# Patient Record
Sex: Female | Born: 1980 | Hispanic: Yes | Marital: Single | Smoking: Former smoker
Health system: Southern US, Community
[De-identification: ages and names within clinical notes are randomized; demographics above are authoritative.]

## PROBLEM LIST (undated history)

## (undated) DIAGNOSIS — F1911 Other psychoactive substance abuse, in remission: Secondary | ICD-10-CM

## (undated) DIAGNOSIS — F329 Major depressive disorder, single episode, unspecified: Secondary | ICD-10-CM

## (undated) DIAGNOSIS — K5900 Constipation, unspecified: Secondary | ICD-10-CM

## (undated) DIAGNOSIS — G43909 Migraine, unspecified, not intractable, without status migrainosus: Secondary | ICD-10-CM

## (undated) DIAGNOSIS — K649 Unspecified hemorrhoids: Secondary | ICD-10-CM

## (undated) DIAGNOSIS — Z9151 Personal history of suicidal behavior: Secondary | ICD-10-CM

## (undated) DIAGNOSIS — Z62810 Personal history of physical and sexual abuse in childhood: Secondary | ICD-10-CM

## (undated) DIAGNOSIS — Z915 Personal history of self-harm: Secondary | ICD-10-CM

## (undated) HISTORY — PX: LIPOSUCTION: SHX10

## (undated) HISTORY — DX: Personal history of physical and sexual abuse in childhood: Z62.810

## (undated) HISTORY — DX: Unspecified hemorrhoids: K64.9

## (undated) HISTORY — PX: BREAST ENHANCEMENT SURGERY: SHX7

## (undated) HISTORY — PX: EYE SURGERY: SHX253

## (undated) HISTORY — DX: Constipation, unspecified: K59.00

---

## 2013-10-18 DIAGNOSIS — Z62810 Personal history of physical and sexual abuse in childhood: Secondary | ICD-10-CM

## 2013-10-18 HISTORY — DX: Personal history of physical and sexual abuse in childhood: Z62.810

## 2014-09-14 DIAGNOSIS — K649 Unspecified hemorrhoids: Secondary | ICD-10-CM

## 2014-09-14 HISTORY — DX: Unspecified hemorrhoids: K64.9

## 2014-10-04 DIAGNOSIS — Z9151 Personal history of suicidal behavior: Secondary | ICD-10-CM | POA: Insufficient documentation

## 2014-10-04 DIAGNOSIS — Z62810 Personal history of physical and sexual abuse in childhood: Secondary | ICD-10-CM | POA: Insufficient documentation

## 2014-10-13 NOTE — L&D Delivery Note (Signed)
Delivery Note At 3:29 AM a viable and healthy female was delivered via Vaginal, Spontaneous Delivery (Presentation: Occiput Anterior).  APGAR: 8, 9; weight 6 lb 2.8 oz (2800 g).   Placenta status: Intact, Spontaneous.  Cord: 3 vessels with the following complications: None.  Cord pH: not sent  Anesthesia: None  Episiotomy: None Lacerations: 1st degree;Perineal Suture Repair: vicryl Est. Blood Loss (mL):  300  Mom to postpartum.  Baby to Couplet care / Skin to Skin.  Brean Carberry MICHAEL 03/08/2015, 6:58 AM

## 2015-02-28 DIAGNOSIS — Z3A33 33 weeks gestation of pregnancy: Secondary | ICD-10-CM | POA: Diagnosis not present

## 2015-02-28 DIAGNOSIS — O4700 False labor before 37 completed weeks of gestation, unspecified trimester: Secondary | ICD-10-CM | POA: Diagnosis present

## 2015-02-28 DIAGNOSIS — O479 False labor, unspecified: Secondary | ICD-10-CM | POA: Diagnosis present

## 2015-02-28 NOTE — Discharge Instructions (Signed)
Preterm Labor Information Preterm labor is when labor starts before you are [redacted] weeks pregnant. The normal length of pregnancy is 39 to 41 weeks.  CAUSES  The cause of preterm labor is not often known. The most common known cause is infection. RISK FACTORS  Having a history of preterm labor.  Having your water break before it should.  Having a placenta that covers the opening of the cervix.  Having a placenta that breaks away from the uterus.  Having a cervix that is too weak to hold the baby in the uterus.  Having too much fluid in the amniotic sac.  Taking drugs or smoking while pregnant.  Not gaining enough weight while pregnant.  Being younger than 18 and older than 33 years old.  Having a low income.  Being African American. SYMPTOMS  Period-like cramps, belly (abdominal) pain, or back pain.  Contractions that are regular, as often as six in an hour. They may be mild or painful.  Contractions that start at the top of the belly. They then move to the lower belly and back.  Lower belly pressure that seems to get stronger.  Bleeding from the vagina.  Fluid leaking from the vagina. TREATMENT  Treatment depends on:  Your condition.  The condition of your baby.  How many weeks pregnant you are. Your doctor may have you:  Take medicine to stop contractions.  Stay in bed except to use the restroom (bed rest).  Stay in the hospital. WHAT SHOULD YOU DO IF YOU THINK YOU ARE IN PRETERM LABOR? Call your doctor right away. You need to go to the hospital right away.  HOW CAN YOU PREVENT PRETERM LABOR IN FUTURE PREGNANCIES?  Stop smoking, if you smoke.  Maintain healthy weight gain.  Do not take drugs or be around chemicals that are not needed.  Tell your doctor if you think you have an infection.  Tell your doctor if you had a preterm labor before. Document Released: 12/26/2008 Document Revised: 07/20/2013 Document Reviewed: 12/26/2008 [employer] Patient  Information 2015 [employer]. This information is not intended to replace advice given to you by your health care provider. Make sure you discuss any questions you have with your health care provider.  

## 2015-02-28 NOTE — Progress Notes (Signed)
Patient ID: Kaitlyn Cardenas, female   DOB: Apr 26, 1981, 34 y.o.   MRN: [id number] 33 week with ctx . No LOF , NO vaginal bleeding  O:VSS  Cx 1 cm , by RN  NST : irreg CTX . FHR 150 + accels reactive  A: ctx , no labor  P: precautions given to pt  D/c home

## 2015-02-28 NOTE — OB Triage Note (Signed)
Pt states she has been having contractions since 9am today.

## 2015-03-08 DIAGNOSIS — O26872 Cervical shortening, second trimester: Secondary | ICD-10-CM | POA: Diagnosis present

## 2015-03-08 DIAGNOSIS — O4700 False labor before 37 completed weeks of gestation, unspecified trimester: Secondary | ICD-10-CM | POA: Diagnosis present

## 2015-03-08 DIAGNOSIS — Z62810 Personal history of physical and sexual abuse in childhood: Secondary | ICD-10-CM | POA: Diagnosis present

## 2015-03-08 DIAGNOSIS — Z3A37 37 weeks gestation of pregnancy: Secondary | ICD-10-CM | POA: Diagnosis present

## 2015-03-08 DIAGNOSIS — Z9882 Breast implant status: Secondary | ICD-10-CM

## 2015-03-08 DIAGNOSIS — Z915 Personal history of self-harm: Secondary | ICD-10-CM

## 2015-03-08 DIAGNOSIS — Z428 Encounter for other plastic and reconstructive surgery following medical procedure or healed injury: Secondary | ICD-10-CM | POA: Diagnosis not present

## 2015-03-08 DIAGNOSIS — IMO0001 Reserved for inherently not codable concepts without codable children: Secondary | ICD-10-CM

## 2015-03-08 HISTORY — DX: Major depressive disorder, single episode, unspecified: F32.9

## 2015-03-08 HISTORY — DX: Personal history of physical and sexual abuse in childhood: Z62.810

## 2015-03-08 HISTORY — DX: Personal history of suicidal behavior: Z91.51

## 2015-03-08 HISTORY — DX: Personal history of self-harm: Z91.5

## 2015-03-08 HISTORY — DX: Other psychoactive substance abuse, in remission: F19.11

## 2015-03-09 NOTE — Discharge Summary (Signed)
Obstetric Discharge Summary Reason for Admission: onset of labor Prenatal Procedures: none Intrapartum Procedures: none Postpartum Procedures: none Complications-Operative and Postpartum: none HEMOGLOBIN  Date Value Ref Range Status  03/09/2015 9.8* 12.0 - 16.0 g/dL Final   HGB  Date Value Ref Range Status  07/25/2012 13.8 12.0-16.0 g/dL Final   HCT  Date Value Ref Range Status  03/09/2015 29.4* 35.0 - 47.0 % Final  07/25/2012 40.0 35.0-47.0 % Final    Physical Exam:  General: alert and cooperative Lochia: appropriate Uterine Fundus: firm  DVT Evaluation: No evidence of DVT seen on physical exam.  Discharge Diagnoses: Term Pregnancy-delivered  Discharge Information: Date: 03/09/2015 Activity: unrestricted Diet: routine Medications: Ibuprofen Condition: stable Instructions: Discharge to: home   Newborn Data: Live born female  Birth Weight: 6 lb 2.8 oz (2800 g) APGAR: 8, 9  Home with mother.  Governor Matos 03/09/2015, 9:42 AM

## 2015-03-09 NOTE — Discharge Instructions (Signed)
Physician Obstetric Discharge Summary  Patient ID: Kaitlyn Cardenas MRN: [id number] DOB/AGE: 03/01/1981 34 y.o.   Date of Admission: 03/08/2015  Date of Discharge:   Admitting Diagnosis: Onset of Labor at [redacted]w[redacted]d  Secondary Diagnosis:   Mode of Delivery: normal spontaneous vaginal delivery     Discharge Diagnosis: No other diagnosis   Intrapartum Procedures:    Post partum procedures:   Complications: none   Brief Hospital Course  Amariah Kierstead is a [id number] who had a SVD on 03/08/15;  for further details of this delivery, please refer to the delivery note.  Patient had an uncomplicated postpartum course.  By time of discharge on PPD#1, her pain was controlled on oral pain medications; she had appropriate lochia and was ambulating, voiding without difficulty and tolerating regular diet.  She was deemed stable for discharge to home.       Labs: CBC Latest Ref Rng 03/09/2015 03/08/2015 07/25/2012  WBC 3.6 - 11.0 K/uL 10.5 15.6(H) 8.6  Hemoglobin 12.0 - 16.0 g/dL 1.4(N) 10.5(L) 13.8  Hematocrit 35.0 - 47.0 % 29.4(L) 31.8(L) 40.0  Platelets 150 - 440 K/uL 183 175 219   O POS  Physical exam:  Blood pressure 116/72, pulse 72, temperature 98.2 F (36.8 C), temperature source Oral, resp. rate 18, height  (1.575 m), weight 75.297 kg (166 lb), last menstrual period 06/22/2014, SpO2 98 %, unknown if currently breastfeeding. General: alert and no distress Lochia: appropriate Abdomen: soft, NT Uterine Fundus: firm Extremities: No evidence of DVT seen on physical exam. No lower extremity edema.  Discharge Instructions: Per After Visit Summary. Activity: Advance as tolerated. Pelvic rest for 6 weeks.  Also refer to Discharge Instructions Diet: Regular Medications:   Medication List    TAKE these medications        ibuprofen 600 MG tablet  Commonly known as:  ADVIL,MOTRIN  Take 1 tablet (600 mg total) by mouth every 6 (six) hours.       Outpatient follow  up:  Postpartum contraception: IUD  Discharged Condition: good  Discharged to: home   Newborn Data: Disposition:home with mother  Apgars: APGAR (1 MIN): 8   APGAR (5 MINS): 9   APGAR (10 MINS):    Baby Feeding: Breast  SCHERMERHORN,THOMAS, MD 03/09/2015 9:36 AM      Breast Pumping Tips If you are breastfeeding, there may be times when you cannot feed your baby directly. Returning to work or going on a trip are common examples. Pumping allows you to store breast milk and feed it to your baby later.  You may not get much milk when you first start to pump. Your breasts should start to make more after a few days. If you pump at the times you usually feed your baby, you may be able to keep making enough milk to feed your baby without also using formula. The more often you pump, the more milk you will produce. WHEN SHOULD I PUMP?   You can begin to pump soon after delivery. However, some experts recommend waiting about 4 weeks before giving your infant a bottle to make sure breastfeeding is going well.  If you plan to return to work, begin pumping a few weeks before. This will help you develop techniques that work best for you. It also lets you build up a supply of breast milk.   When you are with your infant, feed on demand and pump after each feeding.   When you are away from your infant for several hours,  pump for about 15 minutes every 2-3 hours. Pump both breasts at the same time if you can.   If your infant has a formula feeding, make sure to pump around the same time.   If you drink any alcohol, wait 2 hours before pumping.  HOW DO I PREPARE TO PUMP? Your let-down reflexis the natural reaction to stimulation that makes your breast milk flow. It is easier to stimulate this reflex when you are relaxed. Find relaxation techniques that work for you. If you have difficulty with your let-down reflex, try these methods:   Smell one of your infant's blankets or an item of  clothing.   Look at a picture or video of your infant.   Sit in a quiet, private space.   Massage the breast you plan to pump.   Place soothing warmth on the breast.   Play relaxing music.  WHAT ARE SOME GENERAL BREAST PUMPING TIPS?  Wash your hands before you pump. You do not need to wash your nipples or breasts.  There are three ways to pump.  You can use your hand to massage and compress your breast.  You can use a handheld manual pump.  You can use an electric pump.   Make sure the suction cup (flange) on the breast pump is the right size. Place the flange directly over the nipple. If it is the wrong size or placed the wrong way, it may be painful and cause nipple damage.   If pumping is uncomfortable, apply a small amount of purified or modified lanolin to your nipple and areola.  If you are using an electric pump, adjust the speed and suction power to be more comfortable.  If pumping is painful or if you are not getting very much milk, you may need a different type of pump. A lactation consultant can help you determine what type of pump to use.   Keep a full water bottle near you at all times. Drinking lots of fluid helps you make more milk.  You can store your milk to use later. Pumped breast milk can be stored in a sealable, sterile container or plastic bag. Label all stored breast milk with the date you pumped it.  Milk can stay out at room temperature for up to 8 hours.  You can store your milk in the refrigerator for up to 8 days.  You can store your milk in the freezer for 3 months. Thaw frozen milk using warm water. Do not put it in the microwave.  Do not smoke. Smoking can lower your milk supply and harm your infant. If you need help quitting, ask your health care provider to recommend a program.  WHEN SHOULD I CALL MY HEALTH CARE PROVIDER OR A LACTATION CONSULTANT?  You are having trouble pumping.  You are concerned that you are not making  enough milk.  You have nipple pain, soreness, or redness.  You want to use birth control. Birth control pills may lower your milk supply. Talk to your health care provider about your options. Document Released: 03/19/2010 Document Revised: 10/04/2013 Document Reviewed: 07/22/2013 [facility] Patient Information 2015 [place], [place]. This information is not intended to replace advice given to you by your health care provider. Make sure you discuss any questions you have with your health care provider.  Nutrition for the New Mother  A new mother needs good health and nutrition so she can have energy to take care of a new baby. Whether a mother breastfeeds or  formula feeds the baby, it is important to have a well-balanced diet. Foods from all the food groups should be chosen to meet the new mother's energy needs and to give her the nutrients needed for repair and healing.  A HEALTHY EATING PLAN The My Pyramid plan for Moms outlines what you should eat to help you and your baby stay healthy. The energy and amount of food you need depends on whether or not you are breastfeeding. If you are breastfeeding you will need more nutrients. If you choose not to breastfeed, your nutrition goal should be to return to a healthy weight. Limiting calories may be needed if you are not breastfeeding.  HOME CARE INSTRUCTIONS   For a personal plan based on your unique needs, see your Registered Dietitian or visit [email or web address].  Eat a variety of foods. The plan below will help guide you. The following chart has a suggested daily meal plan from the My Pyramid for Moms.  Eat a variety of fruits and vegetables.  Eat more dark green and orange vegetables and cooked dried beans.  Make half your grains whole grains. Choose whole instead of refined grains.  Choose low-fat or lean meats and poultry.  Choose low-fat or fat-free dairy products like milk, cheese, or yogurt. Fruits  Breastfeeding: 2  cups  Non-Breastfeeding: 2 cups  What Counts as a serving?  1 cup of fruit or juice.   cup dried fruit. Vegetables  Breastfeeding: 3 cups  Non-Breastfeeding: 2  cups  What Counts as a serving?  1 cup raw or cooked vegetables.  Juice or 2 cups raw leafy vegetables. Grains  Breastfeeding: 8 oz  Non-Breastfeeding: 6 oz  What Counts as a serving?  1 slice bread.  1 oz ready-to-eat cereal.   cup cooked pasta, rice, or cereal. Meat and Beans  Breastfeeding: 6  oz  Non-Breastfeeding: 5  oz  What Counts as a serving?  1 oz lean meat, poultry, or fish   cup cooked dry beans   oz nuts or 1 egg  1 tbs peanut butter Milk  Breastfeeding: 3 cups  Non-Breastfeeding: 3 cups  What Counts as a serving?  1 cup milk.  8 oz yogurt.  1  oz cheese.  2 oz processed cheese. TIPS FOR THE BREASTFEEDING MOM  Rapid weight loss is not suggested when you are breastfeeding. By simply breastfeeding, you will be able to lose the weight gained during your pregnancy. Your caregiver can keep track of your weight and tell you if your weight loss is appropriate.  Be sure to drink fluids. You may notice that you are thirstier than usual. A suggestion is to drink a glass of water or other beverage whenever you breastfeed.  Avoid alcohol as it can be passed into your breast milk.  Limit caffeine drinks to no more than 2 to 3 cups per day.  You may need to keep taking your prenatal vitamin while you are breastfeeding. Talk with your caregiver about taking a vitamin or supplement. RETURING TO A HEALTHY WEIGHT  The My Pyramid Plan for Moms will help you return to a healthy weight. It will also provide the nutrients you need.  You may need to limit "empty" calories. These include:  High fat foods like fried foods, fatty meats, fast food, butter, and mayonnaise.  High sugar foods like sodas, jelly, candy, and sweets.  Be physically active. Include 30 minutes of exercise  or more each day. Choose an activity you like such as walking, swimming,  biking, or aerobics. Check with your caregiver before you start to exercise. Document Released: 01/06/2008 Document Revised: 12/22/2011 Document Reviewed: 01/06/2008  Call your doctor for increased pain or vaginal bleeding, temperature above 100.4, depression, or concerns.  No strenuous activity or heavy lifting for 6 weeks.  No intercourse, tampons, douching, or enemas for 6 weeks.  No tub baths-showers only.  No driving for 2 weeks or while taking pain medications.  Continue prenatal vitamin and iron.  Increase calories and fluids while breastfeeding.

## 2017-07-08 DIAGNOSIS — R51 Headache: Secondary | ICD-10-CM | POA: Diagnosis present

## 2017-07-08 DIAGNOSIS — Z79899 Other long term (current) drug therapy: Secondary | ICD-10-CM | POA: Diagnosis not present

## 2017-07-08 DIAGNOSIS — Z87891 Personal history of nicotine dependence: Secondary | ICD-10-CM | POA: Insufficient documentation

## 2017-07-08 DIAGNOSIS — G43909 Migraine, unspecified, not intractable, without status migrainosus: Secondary | ICD-10-CM | POA: Diagnosis not present

## 2017-07-08 HISTORY — DX: Migraine, unspecified, not intractable, without status migrainosus: G43.909

## 2017-07-08 MED ADMIN — Ketorolac Tromethamine Inj 30 MG/ML: 30 mg | INTRAVENOUS

## 2017-07-08 MED ADMIN — Diphenhydramine HCl Inj 50 MG/ML: 50 mg | INTRAVENOUS

## 2017-07-08 MED ADMIN — Metoclopramide HCl Inj 5 MG/ML (Base Equivalent): 10 mg | INTRAVENOUS

## 2017-07-08 NOTE — ED Triage Notes (Signed)
Patient with complaint of intermittent headaches times three months. Patient states that she saw NP last Monday for the same and was prescribed Zolmitriptan, Meclizine and Flonase. Patient states that she has had a headache that started this morning and that the medications are not helping. Patient states that she has a history of migraines but has not had a migraine in years. Patient states that she has a neurologist appointment scheduled for Oct 19th.

## 2017-07-08 NOTE — ED Notes (Signed)
Pt alert, oriented, ambulatory. Denies needing interpreter.   States HA x 3 months. States had migraines when she was 20. States HA is circumferential. States blurred vision intermittently in both eyes. States dizzy/lightheaded. Nausea.   States light, sounds, movement bother her intermittently.   States heating pad on neck this morning, "took one of my pills."

## 2017-07-08 NOTE — ED Notes (Signed)
Pt taken to CT.

## 2017-07-08 NOTE — ED Notes (Signed)
Patient transported to CT 

## 2018-04-22 DIAGNOSIS — Z369 Encounter for antenatal screening, unspecified: Secondary | ICD-10-CM

## 2018-04-22 DIAGNOSIS — K5900 Constipation, unspecified: Secondary | ICD-10-CM

## 2018-04-22 HISTORY — DX: Constipation, unspecified: K59.00

## 2018-05-13 VITALS — BP 130/74 | HR 98 | Temp 98.7°F | Resp 18 | Wt 150.4 lb

## 2018-05-13 DIAGNOSIS — O09521 Supervision of elderly multigravida, first trimester: Secondary | ICD-10-CM

## 2018-05-13 DIAGNOSIS — Z3A12 12 weeks gestation of pregnancy: Secondary | ICD-10-CM | POA: Insufficient documentation

## 2018-05-13 DIAGNOSIS — Z31438 Encounter for other genetic testing of female for procreative management: Secondary | ICD-10-CM | POA: Diagnosis not present

## 2018-05-13 DIAGNOSIS — Z3689 Encounter for other specified antenatal screening: Secondary | ICD-10-CM | POA: Insufficient documentation

## 2018-05-13 DIAGNOSIS — Z315 Encounter for genetic counseling: Secondary | ICD-10-CM | POA: Diagnosis not present

## 2018-05-13 DIAGNOSIS — Z8279 Family history of other congenital malformations, deformations and chromosomal abnormalities: Secondary | ICD-10-CM

## 2018-05-13 DIAGNOSIS — Z369 Encounter for antenatal screening, unspecified: Secondary | ICD-10-CM

## 2018-05-13 NOTE — Progress Notes (Addendum)
Referring Provider: [facility] Department Length of Consultation: 45 minutes  [name] was referred to [facility] [place] for genetic counseling because of advanced maternal age and a family history of Down syndrome.  The patient will be 37 years old at the time of delivery.  This note summarizes the information we discussed.    We explained that the chance of a chromosome abnormality increases with maternal age.  Chromosomes and examples of chromosome problems were reviewed.  Humans typically have 46 chromosomes in each cell, with half passed through each sperm and egg.  Any change in the number or structure of chromosomes can increase the risk of problems in the physical and mental development of a pregnancy.   Based upon age of the patient, the chance of any chromosome abnormality was 1 in 49. The chance of Down syndrome, the most common chromosome problem associated with maternal age, was 1 in 19.  The risk of chromosome problems is in addition to the 3% general population risk for birth defects and mental retardation.  The greatest chance, of course, is that the baby would be born in good health.  We discussed the following prenatal screening and testing options for this pregnancy:  First trimester screening, which includes nuchal translucency ultrasound screen and first trimester maternal serum marker screening.  The nuchal translucency has approximately an 80% detection rate for Down syndrome and can be positive for other chromosome abnormalities as well as heart defects.  When combined with a maternal serum marker screening, the detection rate is up to 90% for Down syndrome and up to 97% for trisomy 18.     The chorionic villus sampling procedure is available for first trimester chromosome analysis.  This involves the withdrawal of a small amount of chorionic villi (tissue from the developing placenta).  Risk of pregnancy loss is estimated to be  approximately 1 in 200 to 1 in 100 (0.5 to 1%).  There is approximately a 1% (1 in 100) chance that the CVS chromosome results will be unclear.  Chorionic villi cannot be tested for neural tube defects.     Maternal serum marker screening, a blood test that measures pregnancy proteins, can provide risk assessments for Down syndrome, trisomy 18, and open neural tube defects (spina bifida, anencephaly). Because it does not directly examine the fetus, it cannot positively diagnose or rule out these problems.  Targeted ultrasound uses high frequency sound waves to create an image of the developing fetus.  An ultrasound is often recommended as a routine means of evaluating the pregnancy.  It is also used to screen for fetal anatomy problems (for example, a heart defect) that might be suggestive of a chromosomal or other abnormality.   Amniocentesis involves the removal of a small amount of amniotic fluid from the sac surrounding the fetus with the use of a thin needle inserted through the maternal abdomen and uterus.  Ultrasound guidance is used throughout the procedure.  Fetal cells from amniotic fluid are directly evaluated and > 99.5% of chromosome problems and > 98% of open neural tube defects can be detected. This procedure is generally performed after the 15th week of pregnancy.  The main risks to this procedure include complications leading to miscarriage in less than 1 in 200 cases (0.5%).  We also reviewed the availability of cell free fetal DNA testing from maternal blood to determine whether or not the baby may have either Down syndrome, trisomy 36, or trisomy 56.  This  test utilizes a maternal blood sample and DNA sequencing technology to isolate circulating cell free fetal DNA from maternal plasma.  The fetal DNA can then be analyzed for DNA sequences that are derived from the three most common chromosomes involved in aneuploidy, chromosomes 13, 18, and 21.  If the overall amount of DNA is greater  than the expected level for any of these chromosomes, aneuploidy is suspected.  While we do not consider it a replacement for invasive testing and karyotype analysis, a negative result from this testing would be reassuring, though not a guarantee of a normal chromosome complement for the baby.  An abnormal result is certainly suggestive of an abnormal chromosome complement, though we would still recommend CVS or amniocentesis to confirm any findings from this testing.  Cystic Fibrosis and Spinal Muscular Atrophy (SMA) screening were also discussed with the patient. Both conditions are recessive, which means that both parents must be carriers in order to have a child with the disease.  Cystic fibrosis (CF) is one of the most common genetic conditions in persons of Caucasian ancestry.  This condition occurs in approximately 1 in 2,500 Caucasian persons and results in thickened secretions in the lungs, digestive, and reproductive systems.  For a baby to be at risk for having CF, both of the parents must be carriers for this condition.  Approximately 1 in 78 Caucasian persons is a carrier for CF.  Current carrier testing looks for the most common mutations in the gene for CF and can detect approximately 90% of carriers in the Caucasian population.  This means that the carrier screening can greatly reduce, but cannot eliminate, the chance for an individual to have a child with CF.  If an individual is found to be a carrier for CF, then carrier testing would be available for the partner. As part of [place] 's newborn screening profile, all babies born in the state of [place] will have a two-tier screening process.  Specimens are first tested to determine the concentration of immunoreactive trypsinogen (IRT).  The top 5% of specimens with the highest IRT values then undergo DNA testing using a panel of over 40 common CF mutations. SMA is a neurodegenerative disorder that leads to atrophy of skeletal muscle  and overall weakness.  This condition is also more prevalent in the Caucasian population, with 1 in 40-1 in 60 persons being a carrier and 1 in 6,000-1 in 10,000 children being affected.  There are multiple forms of the disease, with some causing death in infancy to other forms with survival into adulthood.  The genetics of SMA is complex, but carrier screening can detect up to 95% of carriers in the Caucasian population.  Similar to CF, a negative result can greatly reduce, but cannot eliminate, the chance to have a child with SMA.  Prior testing for hemoglobinopathies was normal at [facility].  We obtained a detailed family history and pregnancy history.  The patient and her husband are of [place] ancestry and are non-consanguinous.  The father of the baby reported that one of his brothers has a daughter who required a liver transplant at age 42 months.  He is not aware of the cause for this condition, but we are happy to review this more if a diagnosis is known.  [name] stated that she has a maternal first cousin who had a daughter with Down syndrome who passed away in infancy due to a heart defect.  Down syndrome is caused by an extra copy  of the genetic instructions located on chromosome number 21.  These extra instructions result in the characteristic facial appearance, intellectual disabilities and an increased risk for some types of birth defects.  The majority (95%) of persons with Down syndrome have three freestanding copies of chromosome number 21, called trisomy 9921.  This type of Down syndrome occurs as a sporadic condition and does not increase the chance for other family members to have Down syndrome.  Rarely, Down syndrome is caused by a rearrangement of the genetic instructions, where the extra chromosome number 21 is attached to another chromosome.  This type of chromosome rearrangement can be passed down through families and therefore may increase the chance for Down syndrome in other family  members.  We cannot determine which type of Down syndrome is present without documentation of chromosome studies in the affected family member.  If any additional information is obtained about this history, please do not hesitate to contact us.  The patient reported that she had two ectopic pregnancies and that a maternal first cousin had three miscarriages, one of which was an ectopic pregnancy.  That cousin has two healthy children.  We reviewed that there may be many reasons for miscarriages and that we typically begin to evaluate possible inherited causes when a couple has had three or more unexplained losses, which is not the case in this family.  The remainder of the family history is unremarkable for birth defects, developmental delays, recurrent pregnancy loss or known chromosome abnormalities.  [name] stated that this is her sixth pregnancy.  She has two healthy sons, ages 1820 and 8810, from prior relationships as well as one ectopic pregnancy from a prior relationship.  With her current partner, she has a 36 year old daughter and one loss of an ectopic pregnancy. There have been no exposures or complications in this pregnancy that would be expected to increase the risk for birth defects.  She reported significant pain and pressure in her abdomen and back in this pregnancy and that she had a short cervix in her last pregnancy (which delivered at 37 weeks).  Dr. Fayrene FearingJames spoke with her about this history and a plan for measuring cervical length at her next visit here.  She also referred her for a [facility] consultation [facility].  After consideration of the options, [name] elected to proceed with cell free fetal DNA testing (MaterniT21 PLUS with [facility]) and to decline carrier screening for CF and SMA.  An ultrasound was performed at the time of the visit.  The gestational age was consistent with 12 weeks.  Fetal anatomy could not be assessed due to early gestational age.  Please refer to  the ultrasound report for details of that study.  [name] was encouraged to call with questions or concerns.  We can be contacted at [phone].   Tests Ordered:  MaterniT21 PLUS with [facility]   Cherly Andersoneborah F. Wells, MS, CGC   I was immediately available and supervising. [place] PonderAndra H. Jasa Dundon, MD [facility]

## 2018-05-20 NOTE — Telephone Encounter (Signed)
The patient was informed of the results of her recent MaterniT21 testing which yielded NEGATIVE results.  The patient's specimen showed DNA consistent with two copies of chromosomes 21, 18 and 13.  The sensitivity for trisomy 21, trisomy 18 and trisomy 13 using this testing are reported as 99.1%, 99.9% and 91.7% respectively.  Thus, while the results of this testing are highly accurate, they are not considered diagnostic at this time.  Should more definitive information be desired, the patient may still consider amniocentesis.   As requested to know by the patient, sex chromosome analysis was included for this sample.  Results are consistent with a female fetus (Y chromosome material was detected). This is predicted with >99% accuracy.  A maternal serum AFP only should be considered if screening for neural tube defects is desired.  We may be reached at [phone] with any questions or concerns.   Keenan Dimitrov F. Wayman Hoard, MS, CGC   

## 2018-06-21 DIAGNOSIS — O09522 Supervision of elderly multigravida, second trimester: Secondary | ICD-10-CM

## 2018-06-24 DIAGNOSIS — Z3A18 18 weeks gestation of pregnancy: Secondary | ICD-10-CM | POA: Insufficient documentation

## 2018-06-24 DIAGNOSIS — O09522 Supervision of elderly multigravida, second trimester: Secondary | ICD-10-CM | POA: Insufficient documentation

## 2018-06-24 DIAGNOSIS — Z3689 Encounter for other specified antenatal screening: Secondary | ICD-10-CM | POA: Insufficient documentation

## 2018-07-12 NOTE — Addendum Note (Signed)
Encounter addended by: Lady Deutscher, MD on: 07/12/2018 9:48 AM  Actions taken: Sign clinical note

## 2018-07-19 DIAGNOSIS — O09522 Supervision of elderly multigravida, second trimester: Secondary | ICD-10-CM

## 2018-11-02 DIAGNOSIS — R710 Precipitous drop in hematocrit: Secondary | ICD-10-CM | POA: Diagnosis not present

## 2018-11-02 DIAGNOSIS — Z3A36 36 weeks gestation of pregnancy: Secondary | ICD-10-CM

## 2018-11-02 DIAGNOSIS — O0993 Supervision of high risk pregnancy, unspecified, third trimester: Secondary | ICD-10-CM

## 2018-11-02 DIAGNOSIS — Z87891 Personal history of nicotine dependence: Secondary | ICD-10-CM

## 2018-11-02 MED ADMIN — Lactated Ringer's Solution: INTRAVENOUS | @ 21:00:00 | NDC 00338011704

## 2018-11-02 MED ADMIN — Lidocaine HCl Local Preservative Free (PF) Inj 1%: @ 12:00:00 | NDC 00409427916

## 2018-11-02 MED ADMIN — Lactated Ringer's Solution: INTRAVENOUS | @ 10:00:00 | NDC 00338011704

## 2018-11-02 NOTE — Lactation Note (Addendum)
This note was copied from a baby's chart. Lactation Consultation Note  Patient Name: Kaitlyn Cardenas NOIBB'C Date: 11/02/2018 Reason for consult: Initial assessment;Late-preterm 34-36.6wks   Maternal Data Formula Feeding for Exclusion: No Does the patient have breastfeeding experience prior to this delivery?: Yes Mom had breast augment in 2013 with implants under muscle and inframammary incision, states problem with low milk supply with all kids, nipple cracking with last child, only breastfed a few days   Feeding Feeding Type: Breast Fed  LATCH Score Latch: Repeated attempts needed to sustain latch, nipple held in mouth throughout feeding, stimulation needed to elicit sucking reflex.  Audible Swallowing: A few with stimulation  Type of Nipple: Everted at rest and after stimulation  Comfort (Breast/Nipple): Filling, red/small blisters or bruises, mild/mod discomfort(firm breast, implants under muscle)  Hold (Positioning): Assistance needed to correctly position infant at breast and maintain latch.  LATCH Score: 6  Interventions Interventions: Breast feeding basics reviewed;Assisted with latch;Skin to skin;Adjust position;Support pillows  Lactation Tools Discussed/Used WIC Program: Yes   Consult Status Consult Status: Follow-up Date: 11/02/18 Follow-up type: In-patient    Kaitlyn Cardenas 11/02/2018, 1:14 PM
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
## 2018-11-02 NOTE — Lactation Note (Signed)
This note was copied from a baby's chart. Lactation Consultation Note  Patient Name: Kaitlyn Cardenas NFAOZ'H Date: 11/02/2018 Reason for consult: Initial assessment;Late-preterm 34-36.6wks Low blood sugar of 36, mom desires to supplement with formula for this feeding, offered option to use breast pump to obtain colostrum but mom declines at this time   Maternal Data Formula Feeding for Exclusion: No Does the patient have breastfeeding experience prior to this delivery?: Yes  Feeding Feeding Type: Breast Fed  LATCH Score Latch: Repeated attempts needed to sustain latch, nipple held in mouth throughout feeding, stimulation needed to elicit sucking reflex.  Audible Swallowing: A few with stimulation  Type of Nipple: Everted at rest and after stimulation  Comfort (Breast/Nipple): Filling, red/small blisters or bruises, mild/mod discomfort(firm breast, implants under muscle)  Hold (Positioning): Assistance needed to correctly position infant at breast and maintain latch.  LATCH Score: 6  Interventions Interventions: Breast feeding basics reviewed;Assisted with latch;Skin to skin;Adjust position;Support pillows  Lactation Tools Discussed/Used WIC Program: Yes   Consult Status Consult Status: Follow-up Date: 11/02/18 Follow-up type: In-patient    Dyann Kief 11/02/2018, 4:10 PM

## 2018-11-03 NOTE — Progress Notes (Signed)
Patient ID: Kaitlyn Cardenas, female   DOB: 08/17/1981, 38 y.o.   MRN: [id number] Baby staying . Cancel mother d/c

## 2018-11-03 NOTE — Lactation Note (Signed)
This note was copied from a baby's chart. Lactation Consultation Note  Patient Name: Kaitlyn Cardenas [id number] Date: 11/03/2018 Reason for consult: Follow-up assessment   Maternal Data    Feeding Feeding Type: Bottle Fed - Formula Nipple Type: Extra Slow Flow  LATCH Score                   Interventions    Lactation Tools Discussed/Used     Consult Status Consult Status: Complete Mother of baby states that breastfeeding is going well and that she doesn't have any concerns or questions at this time. This is mom's 4th baby and she has breastfeed baby's siblings all past 15mos successfully. Mom knows where she can go for breastfeeding support/resources after d/c.    Kaitlyn Cardenas 11/03/2018, 12:48 PM

## 2018-11-04 NOTE — Discharge Instructions (Signed)
After Your Delivery Discharge Instructions   Postpartum: Care Instructions  After childbirth (postpartum period), your body goes through many changes. Some of these changes happen over several weeks. In the hours after delivery, your body will begin to recover from childbirth while it prepares to breastfeed your newborn. You may feel emotional during this time. Your hormones can shift your mood without warning for no clear reason.  In the first couple of weeks after childbirth, many women have emotions that change from happy to sad. You may find it hard to sleep. You may cry a lot. This is called the "baby blues." These overwhelming emotions often go away within a couple of days or weeks. But it's important to discuss your feelings with your doctor.  You should call your care provider if you have unrelieved feelings of:  Inability to cope  Sadness  Anxiety  Lack of interest in baby  Insomnia  Crying  It is easy to get too tired and overwhelmed during the first weeks after childbirth. Don't try to do too much. Get rest whenever you can, accept help from others, and eat well and drink plenty of fluids.  About 4 to 6 weeks after your baby's birth, you will have a follow-up visit with your care provider. This visit is your time to talk to your provider about anything you are concerned or curious about.  Follow-up care is a key part of your treatment and safety. Be sure to make and go to all appointments, and call your doctor if you are having problems. It's also a good idea to know your test results and keep a list of the medicines you take.  How can you care for yourself at home?  Sleep or rest when your baby sleeps.  Get help with household chores from family or friends, if you can. Do not try to do it all yourself.  If you have hemorrhoids or swelling or pain around the opening of your vagina, try using cold and heat. You can put ice or a cold pack on the area for 10 to 20 minutes at  a time. Put a thin cloth between the ice and your skin. Also try sitting in a few inches of warm water (sitz bath) 3 times a day and after bowel movements.  Take pain medicines exactly as directed.  If the provider gave you a prescription medicine for pain, take it as prescribed.  If you do not have a prescription and need something over the counter, you can take:  Ibuprofen (Motrin, Advil) up to 600mg every 6 hours as needed for pain  Acetaminophen (Tylenol) up to 650mg every 4 hours as needed for pain  Some people find it helpful to alternate between these two medications.   No driving for 1-2 weeks or while taking pain medications.   Eat more fiber to avoid constipation. Include foods such as whole-grain breads and cereals, raw vegetables, raw and dried fruits, and beans.  Drink plenty of fluids, enough so that your urine is light yellow or clear like water. If you have kidney, heart, or liver disease and have to limit fluids, talk with your doctor before you increase the amount of fluids you drink.  Do not put anything in the vagina for 6 weeks. This means no sex, no tampons, no douching, and no enemas.  If you have stitches, keep the area clean by pouring or spraying warm water over the area outside your vagina and anus after you use the toilet.    No strenuous activity or heavy lifting for 6 weeks   No tub baths; showers only  Continue prenatal vitamin and iron.  If breastfeeding:  Increase calories and fluids while breastfeeding.  You may have a slight fever when your milk comes in, but it should go away on its own. If it does not, and rises above 101.0 please call the doctor.  For breastfeeding concerns, the lactation consultant can be reached at [phone].  For concerns about your baby, please call your pediatrician.   Keep a list of questions to bring to your postpartum visit. Your questions might be about:  Changes in your breasts, such as lumps or  soreness.  When to expect your menstrual period to start again.  What form of birth control is best for you.  Weight you have put on during the pregnancy.  Exercise options.  What foods and drinks are best for you, especially if you are breastfeeding.  Problems you might be having with breastfeeding.  When you can have sex. Some women may want to talk about lubricants for the vagina.  Any feelings of sadness or restlessness that you are having.   When should you call for help?  Call 911 anytime you think you may need emergency care. For example, call if:  You have thoughts of harming yourself, your baby, or another person.  You passed out (lost consciousness).  Call the office at [phone] or seek immediate medical care if:  If you have heavy bleeding such that you are soaking 1 pad in an hour for 2 hours  You are dizzy or lightheaded, or you feel like you may faint.  You have a fever; a temperature of 101.0 F or greater  Chills  Difficulty urinating  Headache unrelieved by "pain meds"   Visual changes  Pain in the right side of your belly near your ribs  Breasts reddened, hard, hot to the touch or any other breast concerns  Nipple discharge which is foul-smelling or contains pus   Increased pain at the site of the laceration   New pain unrelieved with recommended over-the-counter dosages  Difficulty breathing with or without chest pain   New leg pain, swelling, or redness, especially if it is only on one leg  Any other concerns  Watch closely for changes in your health, and be sure to contact your provider if:  You have new or worse vaginal discharge.  You feel sad or depressed.  You are having problems with your breasts or breastfeeding.    

## 2018-11-04 NOTE — Lactation Note (Incomplete)
This note was copied from a baby's chart. Lactation Consultation Note  Patient Name: Boy Shaniqua Huyser Today's Date: 11/04/2018     Maternal Data Formula Feeding for Exclusion: Yes Reason for exclusion: Mother's choice to formula and breast feed on admission Has patient been taught Hand Expression?: Yes Does the patient have breastfeeding experience prior to this delivery?: Yes  Feeding Feeding Type: Bottle Fed - Formula Nipple Type: Slow - flow  LATCH Score                   Interventions    Lactation Tools Discussed/Used     Consult Status  Mother is both breast and formula feeding. She states that her nipples are sore and cracking but does not think it is due to baby Daniel's latch. Mother states that her nipples were sore and cracked with her previous child while she breastfed and also when she was not breastfeeding    Arlyss Gandy 11/04/2018, 12:19 PM

## 2018-11-04 NOTE — Progress Notes (Signed)
Discharge order received from doctor. Reviewed discharge instructions and prescriptions with patient and answered all questions. Follow up appointment instructions given. Patient verbalized understanding. ID bands checked. Patient discharged home with infant via wheelchair by nursing/auxillary.    Markes Shatswell Garner, RN  

## 2018-11-12 NOTE — Lactation Note (Signed)
This note was copied from a baby's chart. Lactation Consultation Note  Patient Name: [name] Today's Date: 11/12/2018     Maternal Data    Feeding    LATCH Score                   Interventions    Lactation Tools Discussed/Used     Consult Status  Mother and baby Kaitlyn Cardenas are here today for an outpatient lactation consultation for a weighted feeding. Daniel's weight before the feeding is 3084 grams (6 lbs 12.8 oz). Mother breast-fed Kaitlyn Cardenas for 10 minutes on each breast. Throughout the feeding mother has to stimulate Kaitlyn Cardenas to stay awake and she mentions that he falls asleep during feedings. She denies any pain or discomfort during breastfeeding. After breastfeeding, Daniel's weight after the feeding is 3106 grams (6 lbs 13.6 oz). Kaitlyn Cardenas took approximately 22 mL during this breastfeeding session. Mother states that she breast-fed Kaitlyn Cardenas before coming to the hospital and he fed for about 7 minutes per side.    Arlyss Gandy 11/12/2018, 3:20 PM

## 2019-12-15 DIAGNOSIS — Z30430 Encounter for insertion of intrauterine contraceptive device: Secondary | ICD-10-CM

## 2019-12-15 DIAGNOSIS — Z3009 Encounter for other general counseling and advice on contraception: Secondary | ICD-10-CM

## 2019-12-15 NOTE — Progress Notes (Signed)
Patient here for IUD, unsure what type. Would like to discuss options, and states she and her partner have been wanting to schedule vasectomy, but unable to connect with [facility] .Marland KitchenBurt Knack, RN

## 2019-12-15 NOTE — Progress Notes (Signed)
Mirena consents signed and instructions given. Patient given Mirena card.Burt Knack, RN

## 2019-12-15 NOTE — Progress Notes (Signed)
Patient presented to [facility] for IUD insertion. Her GC/CT screening was not up to date so this was collected today and using WHO criteria we can be reasonably certain she is not pregnant . Urine pregnancy test  today was N/A- no sex since August and had recent LMP 2/18.  See Flowsheet for IUD check list  IUD Insertion Procedure Note Patient identified, informed consent performed, consent signed.   Discussed risks of irregular bleeding, cramping, infection, malpositioning or misplacement of the IUD outside the uterus which may require further procedure such as laparoscopy. Time out was performed.    Speculum placed in the vagina.  Cervix visualized.  Cleaned with Betadine x 2.  Uterus sounded to 8 cm.  IUD placed per manufacturer's recommendations.  Strings trimmed to 3 cm.  Patient tolerated procedure well.   Patient was given post-procedure instructions- both agency handout and verbally by provider.  She was advised to have backup contraception for one week.  Patient was also asked to check IUD strings periodically or follow up in 4 weeks for IUD check.

## 2020-07-27 DIAGNOSIS — Z113 Encounter for screening for infections with a predominantly sexual mode of transmission: Secondary | ICD-10-CM

## 2020-07-27 NOTE — Progress Notes (Signed)
Wet mount reviewed by provider, no tx per provider order. Provider orders completed. 
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
## 2020-12-30 DIAGNOSIS — M5136 Other intervertebral disc degeneration, lumbar region: Secondary | ICD-10-CM

## 2020-12-30 DIAGNOSIS — Z87891 Personal history of nicotine dependence: Secondary | ICD-10-CM | POA: Diagnosis not present

## 2020-12-30 DIAGNOSIS — M545 Low back pain, unspecified: Secondary | ICD-10-CM | POA: Diagnosis present

## 2020-12-30 DIAGNOSIS — M5126 Other intervertebral disc displacement, lumbar region: Secondary | ICD-10-CM | POA: Diagnosis not present

## 2020-12-30 NOTE — ED Triage Notes (Signed)
Pt states was washing dishes standing up and when she was done tonight she was "not able to move". Pt complains of lower back pain and "not being able to get up If is sit". Pt is ambulatory slowly, denies change in sensation to lower extremities or loss of bowel or bladder.
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
## 2021-09-16 DIAGNOSIS — R079 Chest pain, unspecified: Secondary | ICD-10-CM | POA: Diagnosis not present

## 2021-09-16 DIAGNOSIS — R059 Cough, unspecified: Secondary | ICD-10-CM

## 2021-09-16 DIAGNOSIS — M5412 Radiculopathy, cervical region: Secondary | ICD-10-CM | POA: Diagnosis present

## 2021-09-16 DIAGNOSIS — R0602 Shortness of breath: Secondary | ICD-10-CM | POA: Diagnosis present
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
## 2021-09-16 NOTE — Discharge Instructions (Addendum)
Your EKG and chest x-ray normal.  All of your labs are also normal.  I suspect that your neck pain is what is causing your arm pain and numbness.  The chest pain is most likely muscular type chest pain.  I have sent a corticosteroid to the pharmacy for you.  You can take Tylenol for pain relief.  Also consider muscle rubs.  Similar information about neck pain below.  Make a follow-up appointment with your PCP for later this week or early next week for recheck.  Otherwise, you should go to [facility] walk-in urgent care in [place] for any worsening symptoms.  See red flag signs and symptoms or chest pain below but if your chest pain is not improving the next couple of days, you have increased shortness of breath, coughing up blood, chest pain on breathing, weakness you should call 911 or go to the ER for further work-up.  NECK PAIN: Stressed avoiding painful activities. This can exacerbate your symptoms and make them worse.  May apply heat to the areas of pain for some relief. Use medications as directed. Be aware of which medications make you drowsy and do not drive or operate any kind of heavy machinery while using the medication (ie pain medications or muscle relaxers). F/U with PCP for reexamination or return sooner if condition worsens or does not begin to improve over the next few days.   NECK PAIN RED FLAGS: If symptoms get worse than they are right now, you should come back sooner for re-evaluation. If you have increased numbness/ tingling or notice that the numbness/tingling is affecting the legs or saddle region, go to ER. If you ever lose continence go to ER.      CHEST PAIN RED FLAGS:  The main red flags to look out for that could mean a serious heart problem are; pain in the center of the chest, difficulty breathing, nausea, sweating, clammy skin, dizziness, pain radiating to the jaw/neck/arm, racing heart, etc. If any of those symptoms are present, you must immediately get to the ER.   You  have a condition requiring you to follow up with Orthopedics so please call one of the following office for appointment:   [facility] [address] [place], [place] [address] Phone: [phone]  [facility] [address], [place], [place] [address] Phone: [phone]

## 2021-09-16 NOTE — ED Triage Notes (Addendum)
Pt presents with complaints of shortness of breath, fatigue, nausea, neck stiffness, nasal congestion, and headache since this morning. Pt reports relief with sudafed. Pt also is having some numbness and tingling in her left hand and arm since she woke up this morning. Pt reports having covid x 2 weeks ago.

## 2021-10-20 NOTE — ED Notes (Signed)
Called pt x2 for triage without answer 

## 2021-10-20 NOTE — ED Notes (Signed)
No answer when called for triage 

## 2021-12-11 NOTE — Telephone Encounter (Signed)
Pt would like to speak to a nurse because she needs to know what her blood type is and she assumes that it would be on her file. ?

## 2021-12-13 NOTE — Telephone Encounter (Signed)
Phone call to pt. No answer. Message left on voicemail explaining RN returning call and she may contact [facility] Mon - Fri between 8 am and 5 pm. Jerel Shepherd, RN ? ?

## 2021-12-16 NOTE — Telephone Encounter (Signed)
Return call to patient this morning regarding her question about what blood type she is.  Per EPIC, [facility] has never drawn her blood type.  Will refer her to her OB GYN providers for that information.  Left a message to please return my call at [phone].  Hart Carwin, RN ? ?

## 2021-12-17 NOTE — Telephone Encounter (Signed)
Return call to patient regarding her question about what blood type she is.  Left a message to return my call at [phone].  Will direct her to call her OB GYN provider for that information due to [facility] has never drawn her blood type from what I can see in Epic. Hart Carwin, RN ? ?

## 2021-12-19 NOTE — Telephone Encounter (Signed)
No response to the attempted return calls made and messages left regarding her question about what blood type she is.  Will close phone note and await call from patient. Hart Carwin, RN ? ?

## 2022-02-10 DIAGNOSIS — R1030 Lower abdominal pain, unspecified: Secondary | ICD-10-CM | POA: Diagnosis not present

## 2022-02-10 DIAGNOSIS — R110 Nausea: Secondary | ICD-10-CM

## 2022-02-10 DIAGNOSIS — R197 Diarrhea, unspecified: Secondary | ICD-10-CM

## 2022-02-10 DIAGNOSIS — R000 Tachycardia, unspecified: Secondary | ICD-10-CM | POA: Diagnosis not present

## 2022-02-10 DIAGNOSIS — Z20822 Contact with and (suspected) exposure to covid-19: Secondary | ICD-10-CM | POA: Insufficient documentation

## 2022-02-10 DIAGNOSIS — R198 Other specified symptoms and signs involving the digestive system and abdomen: Secondary | ICD-10-CM

## 2022-02-10 DIAGNOSIS — R1084 Generalized abdominal pain: Secondary | ICD-10-CM

## 2022-02-10 NOTE — ED Notes (Signed)
Pt return from CT.

## 2022-02-10 NOTE — Discharge Instructions (Addendum)
Your CT today showed: ?FINDINGS: ?Lower chest: Lung bases demonstrate no acute consolidation or ?effusion. Normal cardiac size. ?Hepatobiliary: No focal liver abnormality is seen. No gallstones, ?gallbladder wall thickening, or biliary dilatation. ?Pancreas: Unremarkable. No pancreatic ductal dilatation or ?surrounding inflammatory changes. ?Spleen: Normal in size without focal abnormality. ?Adrenals/Urinary Tract: Adrenal glands are unremarkable. Kidneys are ?normal, without renal calculi, focal lesion, or hydronephrosis. ?Bladder is unremarkable. ?Stomach/Bowel: Stomach is nonenlarged. Fluid-filled nondilated loops ?of mid to distal small bowel, alternating with areas of thickened ?appearing small bowel with mucosal enhancement, for example series ?2, image 62. Negative appendix. ?Vascular/Lymphatic: No significant vascular findings are present. No ?enlarged abdominal or pelvic lymph nodes. ?Reproductive: Uterus and bilateral adnexa are unremarkable. ?Other: No free air.  Small free fluid in the pelvis ?Musculoskeletal: No acute or significant osseous findings. ?  ?IMPRESSION: ?1. Fluid-filled nondilated mid to distal small bowel with areas of ?small-bowel wall thickening and mucosal enhancement, findings ?suspected to be related to enteritis/ileus either due to infection ?or inflammatory bowel disease. ?2. Small free fluid in the pelvis ?

## 2022-02-10 NOTE — Discharge Instructions (Signed)
I recommend you go to the emergency room given the severity of your pain and since we do not have imaging capabilities in urgent care.  Please have your husband take either.  You were given 1 dose of Zofran in clinic today. ?
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
## 2022-02-10 NOTE — ED Notes (Signed)
Pt to CT

## 2022-02-10 NOTE — ED Notes (Signed)
Dr. Smith at the bedside.  

## 2022-02-10 NOTE — ED Triage Notes (Signed)
Pt c/o generalized abd cramping with nausea and  diarrhea since around 10am. And having pelvic pressure ?

## 2022-02-10 NOTE — ED Notes (Signed)
Pt signed esignature  D/c inst to pt iv dced 

## 2022-02-10 NOTE — ED Triage Notes (Signed)
Patient presents to Urgent Care with complaints of constant abdominal pain and diarrhea that started this morning. She states pain is at the center, describes it as cramping.  ?She reports eating [place] [employer] yesterday.   ? ?Denies fever, vomiting, changes in diet, or medications.  ?

## 2022-07-21 DIAGNOSIS — Z01812 Encounter for preprocedural laboratory examination: Secondary | ICD-10-CM
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
## 2022-07-25 DIAGNOSIS — F32A Depression, unspecified: Secondary | ICD-10-CM | POA: Insufficient documentation

## 2022-07-25 DIAGNOSIS — Z01812 Encounter for preprocedural laboratory examination: Secondary | ICD-10-CM

## 2022-07-25 DIAGNOSIS — Z8049 Family history of malignant neoplasm of other genital organs: Secondary | ICD-10-CM | POA: Diagnosis not present

## 2022-07-25 DIAGNOSIS — Z302 Encounter for sterilization: Secondary | ICD-10-CM | POA: Diagnosis present

## 2022-07-25 DIAGNOSIS — N920 Excessive and frequent menstruation with regular cycle: Secondary | ICD-10-CM | POA: Insufficient documentation

## 2022-07-25 DIAGNOSIS — Z3009 Encounter for other general counseling and advice on contraception: Secondary | ICD-10-CM

## 2022-07-25 HISTORY — PX: DILATATION & CURETTAGE/HYSTEROSCOPY WITH MYOSURE: SHX6511

## 2022-07-25 HISTORY — PX: DILITATION & CURRETTAGE/HYSTROSCOPY WITH NOVASURE ABLATION: SHX5568

## 2022-07-25 HISTORY — PX: LAPAROSCOPIC TUBAL LIGATION: SHX1937

## 2022-07-25 NOTE — Anesthesia Procedure Notes (Signed)
Procedure Name: Intubation Date/Time: 07/25/2022 9:10 AM  Performed by: Debe Coder, CRNAPre-anesthesia Checklist: Patient identified, Emergency Drugs available, Suction available and Patient being monitored Patient Re-evaluated:Patient Re-evaluated prior to induction Oxygen Delivery Method: Circle system utilized Preoxygenation: Pre-oxygenation with 100% oxygen Induction Type: IV induction Ventilation: Mask ventilation without difficulty Laryngoscope Size: McGraph and 3 Grade View: Grade I Tube type: Oral Tube size: 6.5 mm Number of attempts: 1 Airway Equipment and Method: Stylet and Oral airway Placement Confirmation: ETT inserted through vocal cords under direct vision, positive ETCO2 and breath sounds checked- equal and bilateral Secured at: 20 cm Tube secured with: Tape Dental Injury: Teeth and Oropharynx as per pre-operative assessment

## 2022-07-25 NOTE — Anesthesia Postprocedure Evaluation (Signed)
Anesthesia Post Note  Patient: Kaitlyn Cardenas  Procedure(s) Performed: LAPAROSCOPIC TUBAL LIGATION (Bilateral: Uterus) DILATATION & CURETTAGE/HYSTEROSCOPY WITH NOVASURE ABLATION DILATATION & CURETTAGE/HYSTEROSCOPY WITH MYOSURE (Uterus)  Patient location during evaluation: PACU Anesthesia Type: General Level of consciousness: awake and alert Pain management: pain level controlled Vital Signs Assessment: post-procedure vital signs reviewed and stable Respiratory status: spontaneous breathing, nonlabored ventilation, respiratory function stable and patient connected to nasal cannula oxygen Cardiovascular status: blood pressure returned to baseline and stable Postop Assessment: no apparent nausea or vomiting Anesthetic complications: no   No notable events documented.   Last Vitals:  Vitals:   07/25/22 1145 07/25/22 1207  BP: 102/76 103/61  Pulse: (!) 51 (!) 52  Resp: 14 16  Temp:  (!) 36.2 C  SpO2: 99%     Last Pain:  Vitals:   07/25/22 1207  TempSrc: Tympanic  PainSc: Asleep                 Arita Miss

## 2022-07-25 NOTE — Op Note (Signed)
Operative Report Laparoscopic bilateral tubal ligation Hysteroscopy with Dilation and Curettage; Novasure ablation   Indications: Heavy bleeding  Pre-operative Diagnosis: Menorrhagia, undesired fertility.  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 Exam under anesthesia 2. Laparoscopic bilateral tubal ligation 3.  Dilation and curettage 4. Operative hysteroscopy with myosure to remove excess proliferative tissue 5. Novasure endometrial ablation 6. Additionally, biopsy of posterior serosa  Surgeon: Cline Cools, MD  Assistant(s):  None  Anesthesia: General endotracheal anesthesia  Anesthesiologist: No responsible provider has been recorded for the case. Anesthesiologist: Corinda Gubler, MD CRNA: Reece Agar, CRNA  Estimated Blood Loss:  Minimal         Intraoperative medications: Toradol         Total IV Fluids:  Urine Output:         Specimens: Endocervical curettings, endometrial curettings         Complications:  None; patient tolerated the procedure well.         Disposition: PACU - hemodynamically stable.         Condition: stable  Findings: Uterus measuring 8.5 cm by sound; normal patulous cervix, vagina, perineum. Apical descent to the hymenal ring.   Left sided ovarian fossa and posterior cul de sac intraperitoneal adhesions were noted, filmy but extensive.  Uterus with extensive proliferative clear blebs throughout the posterior serosa and cervix, extending into the left pelvic sidewall.  I did biopsy this area.  Cervical length: 4.5 cm Uterine cavity length: 4 cm Uterine cavity width: 4.3 cm Power in watts: 92 Total time: 120 secs  Indication for procedure/Consents: 41 y.o. Kaitlyn Cardenas  here for scheduled surgery for the aforementioned diagnoses.  Risks of surgery were discussed with the patient including but not limited to: bleeding which may require transfusion; infection which  may require antibiotics; injury to uterus or surrounding organs; intrauterine scarring which may impair future fertility; need for additional procedures including laparotomy or laparoscopy; and other postoperative/anesthesia complications. Written informed consent was obtained.    Procedure Details:   The patient was taken to the operating room where general anesthesia was administered and was found to be adequate.  After a formal and adequate timeout was performed, she was placed in the dorsal lithotomy position and examined with the above findings. She was then prepped and draped in the sterile manner.   Her bladder was catheterized for an estimated amount of clear, yellow urine. A weighed speculum was then placed in the patient's vagina and a single tooth tenaculum was applied to the anterior lip of the cervix.  TECHNIQUE:  The patient was taken to the operating room where general anesthesia was obtained without difficulty.  She was then placed in the dorsal lithotomy position and prepared and draped in sterile fashion.  The bladder was cathed for an estimated amount of clear urine. After an adequate timeout was performed, a bivalved speculum was then placed in the patient's vagina, and the anterior lip of cervix grasped with the single-tooth tenaculum.  The uterine manipulator was then advanced into the uterus.  The speculum was removed from the vagina.   Attention was then turned to the patient's abdomen where a 5-mm skin incision was made in the umbilical fold.  The Optiview 5-mm trocar and sleeve were then advanced without difficulty with the laparoscope under direct visualization into the abdomen.  The abdomen was then insufflated with carbon dioxide gas and adequate pneumoperitoneum was obtained.  A survey of the patient's pelvis  and abdomen revealed entirely normal anatomy.     The fallopian tubes were observed and found to be normal in appearance. A 55mm port was placed in each lower quadrant  under direct visualization. A Ligasure device was then advanced through the operative port and used to coagulate and excise the distal portion of the Fallopian tube, including the fimbriated ends.  Good blanching and coagulation was noted at the site of the application.  There was no bleeding noted in the mesosalpinx.  A similar process was carried out on the right fallopian tube allowing for bilateral tubal sterilization.   Good hemostasis was noted overall. The instruments were then removed from the patient's abdomen and the fascial incision was repaired with 0 Vicryl, and the skin was closed with Dermabond.  The uterine manipulator was removed from the vagina without complications.   Her cervix was serially dilated to 15 [place] using Hanks dilators. The hysteroscope was introduced to reveal the above findings.The hysteroscope was also used to determine the level of the internal os, and measurements were confirmed. The uterine cavity was carefully examined, both ostia were recognized, and diffusely proliferative endometrium with polypoid fragments was noted.    The cavity appeared to be heart-shaped, in a standard arcuate formation.    A sharp curettage was then performed until there was a gritty texture in all four quadrants.    NOVASURE PROCEDURE DETAILS:   The cervix was further dilated to accommodate the NovaSure device.  The NovaSure device was inserted, and the cavity width was determined. Using a power of 92 watts, for 13 sec, the endometrial ablation was performed.   The hysteroscope was re-introduced into the uterine cavity to note a cauterized lining with some areas at the top of the cervix with bleeding. The tenaculum was removed from the anterior lip of the cervix, and the vaginal speculum was removed after noting good hemostasis.  She received iv acetaminophen and Toradol prior to leaving the OR. The patient tolerated the procedure well and was taken to the recovery area awake, extubated  and in stable condition.  The patient will be discharged to home as per PACU criteria.  Routine postoperative instructions given.  She was prescribed Percocet, Ibuprofen and Colace.  She will follow up in the clinic in two weeks for postoperative evaluation.
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
## 2022-07-25 NOTE — Interval H&P Note (Signed)
History and Physical Interval Note:  07/25/2022 7:16 AM  Kaitlyn Cardenas  has presented today for surgery, with the diagnosis of undesired fertility, abnormal uterine bleeding.  The various methods of treatment have been discussed with the patient and family. After consideration of risks, benefits and other options for treatment, the patient has consented to  Procedure(s): LAPAROSCOPIC TUBAL LIGATION (Bilateral) DILATATION & CURETTAGE/HYSTEROSCOPY WITH NOVASURE ABLATION (N/A) as a surgical intervention.  The patient's history has been reviewed, patient examined, no change in status, stable for surgery.  I have reviewed the patient's chart and labs.  Questions were answered to the patient's satisfaction.     Benjaman Kindler

## 2022-07-25 NOTE — Discharge Instructions (Addendum)
Laparoscopic Tubal Ligation Discharge Instructions  Laparoscopic tubal ligation and fulguration ties your fallopian tubes to prevent pregnancy in the future.   For the next three days, take ibuprofen and acetaminophen on a schedule, every 8 hours. You can take them together or you can intersperse them, and take one every four hours. I also gave you gabapentin for nighttime, to help you sleep and also to control pain. Take gabapentin medicines at night for at least the next 3 nights. You also have a narcotic, oxycodone, to take as needed if the above medicines don't help.  Postop constipation is a major cause of pain. Stay well hydrated, walk as you tolerate, and take over the counter senna as well as stool softeners if you need them.  RISKS AND COMPLICATIONS  Infection. Bleeding. Injury to surrounding organs. Anesthetic side effects. Failure of the procedure. Risks of future ectopic pregnancy  PROCEDURE  You may be given a medicine to help you relax (sedative) before the procedure. You will be given a medicine to make you sleep (general anesthetic) during the procedure. A tube will be put down your throat to help your breath while under general anesthesia. Two small cuts (incisions) are made in the lower abdominal area and one incision is made near the belly button. Your abdominal area will be inflated with a safe gas (carbon dioxide). This helps give the surgeon room to operate, visualize, and helps the surgeon avoid other organs. A thin, lighted tube (laparoscope) with a camera attached is inserted into your abdomen through the incision near the belly button. Other small instruments may also be inserted through other abdominal incisions. The fallopian tube is located and are removed. After the fallopian tube is removed, the gas is released from the abdomen. The incisions will be closed with stitches (sutures), and Dermabond. A bandage may be placed over the incisions.  AFTER THE  PROCEDURE  You will also have some mild abdominal discomfort for 3-7 days. You will be given pain medicine to ease any discomfort. As long as there are no problems, you may be allowed to go home. Someone will need to drive you home and be with you for at least 24 hours once home. You may have some mild discomfort in the throat. This is from the tube placed in your throat while you were sleeping. You may experience discomfort in the shoulder area from some trapped air between the liver and diaphragm. This sensation is normal and will slowly go away on its own.  HOME CARE INSTRUCTIONS  Take all medicines as directed. Only take over-the-counter or prescription medicines for pain, discomfort, or fever as directed by your caregiver. Resume daily activities as directed. Showers are preferred over baths. You may resume sexual activities in 1 week or as directed. Do not drive while taking narcotics.  SEEK MEDICAL CARE IF: . There is increasing abdominal pain. You feel lightheaded or faint. You have the chills. You have an oral temperature above 102 F (38.9 C). There is pus-like (purulent) drainage from any of the wounds. You are unable to pass gas or have a bowel movement. You feel sick to your stomach (nauseous) or throw up (vomit).  MAKE SURE YOU:  Understand these instructions. Will watch your condition. Will get help right away if you are not doing well or get worse.  [employer] Patient Information 2013 [employer]   AMBULATORY SURGERY  DISCHARGE INSTRUCTIONS   The drugs that you were given will stay in your system until tomorrow so   for the next 24 hours you should not:  Drive an automobile Make any legal decisions Drink any alcoholic beverage   You may resume regular meals tomorrow.  Today it is better to start with liquids and gradually work up to solid foods.  You may eat anything you prefer, but it is better to start with liquids, then soup and crackers, and gradually  work up to solid foods.   Please notify your doctor immediately if you have any unusual bleeding, trouble breathing, redness and pain at the surgery site, drainage, fever, or pain not relieved by medication.    Additional Instructions:   Please contact your physician with any problems or Same Day Surgery at [phone], Monday through Friday 6 am to 4 pm, or [place] [facility] Main number at [phone].    

## 2022-07-25 NOTE — Transfer of Care (Signed)
Immediate Anesthesia Transfer of Care Note  Patient: Kaitlyn Cardenas  Procedure(s) Performed: LAPAROSCOPIC TUBAL LIGATION (Bilateral: Uterus) DILATATION & CURETTAGE/HYSTEROSCOPY WITH NOVASURE ABLATION DILATATION & CURETTAGE/HYSTEROSCOPY WITH MYOSURE (Uterus)  Patient Location: PACU  Anesthesia Type:General  Level of Consciousness: awake, drowsy and patient cooperative  Airway & Oxygen Therapy: Patient Spontanous Breathing  Post-op Assessment: Report given to RN and Post -op Vital signs reviewed and stable  Post vital signs: Reviewed and stable  Last Vitals:  Vitals Value Taken Time  BP 93/65 07/25/22 1050  Temp 35.4 C 07/25/22 1050  Pulse 56 07/25/22 1053  Resp 0 07/25/22 1053  SpO2 99 % 07/25/22 1053  Vitals shown include unvalidated device data.  Last Pain:  Vitals:   07/25/22 1050  TempSrc:   PainSc: Asleep         Complications: No notable events documented.

## 2022-07-29 IMAGING — CR DG CHEST 2V
2 series · 2 of 2 positions shown · non-contrast
Comparison: None.

CLINICAL DATA: Cough

EXAM:
CHEST - 2 VIEW

[chest pa]
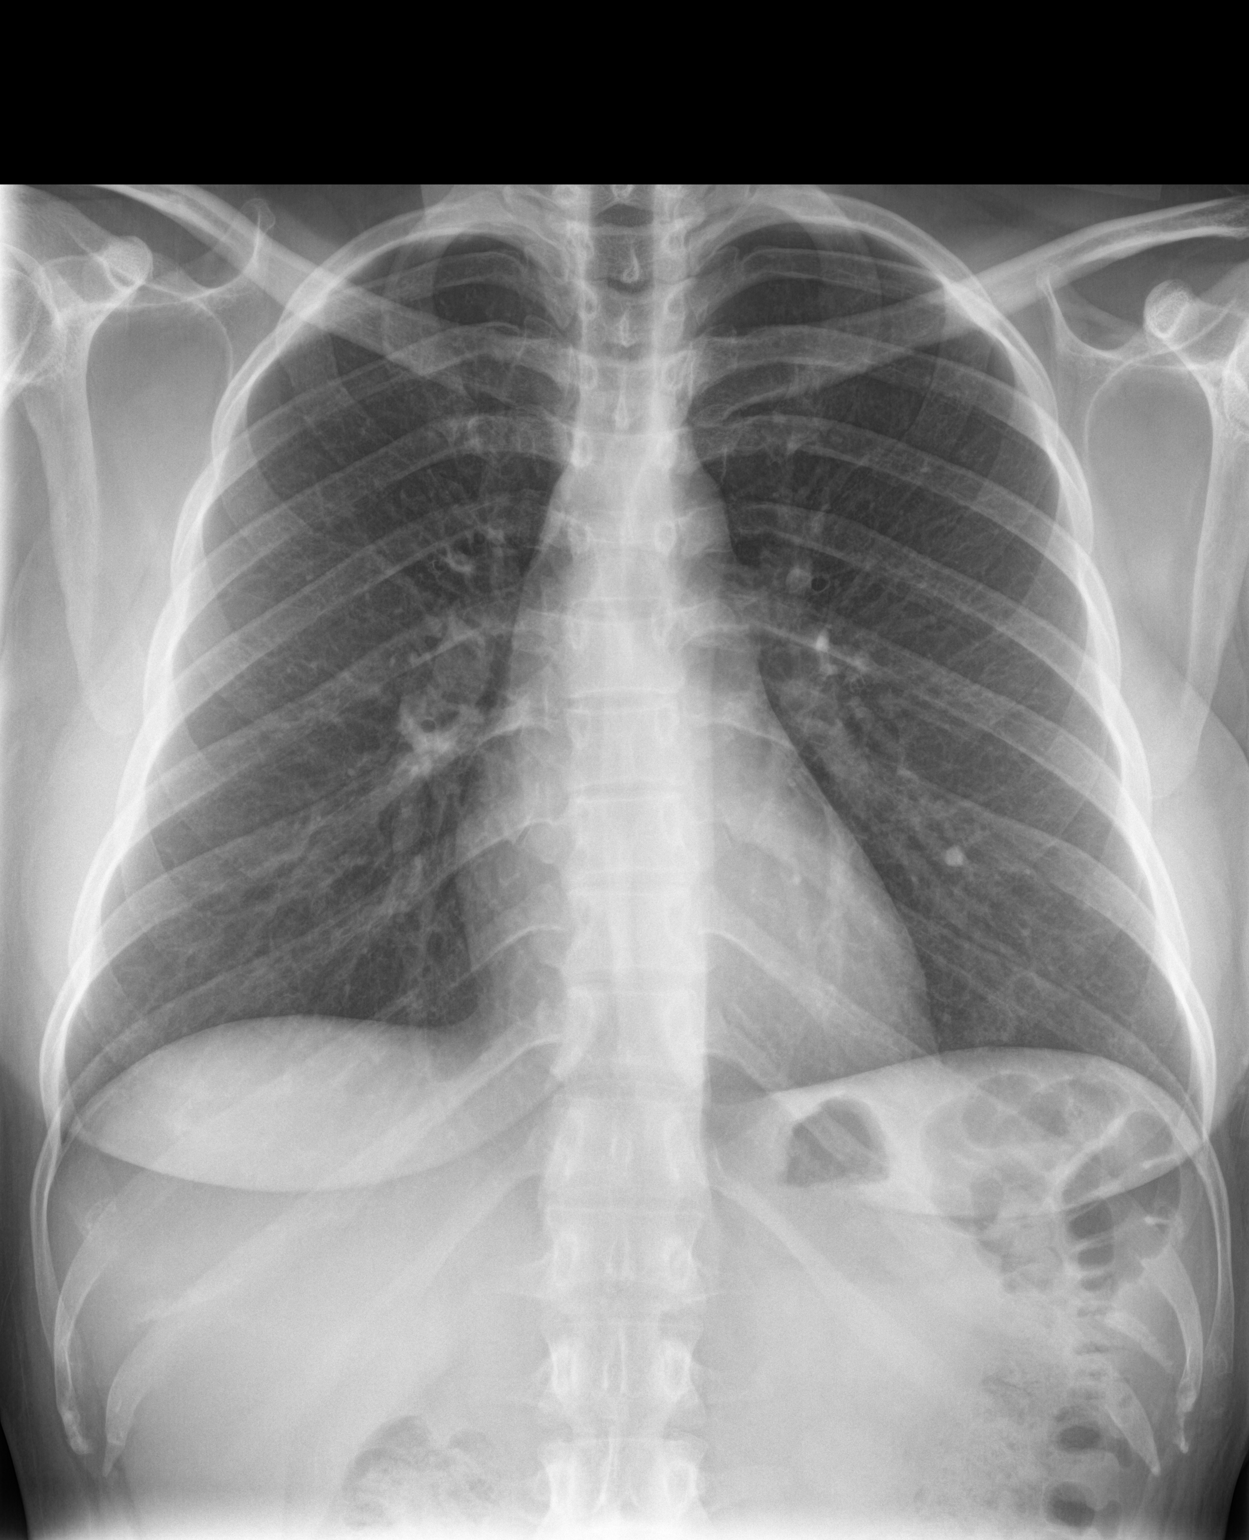

[chest lat]
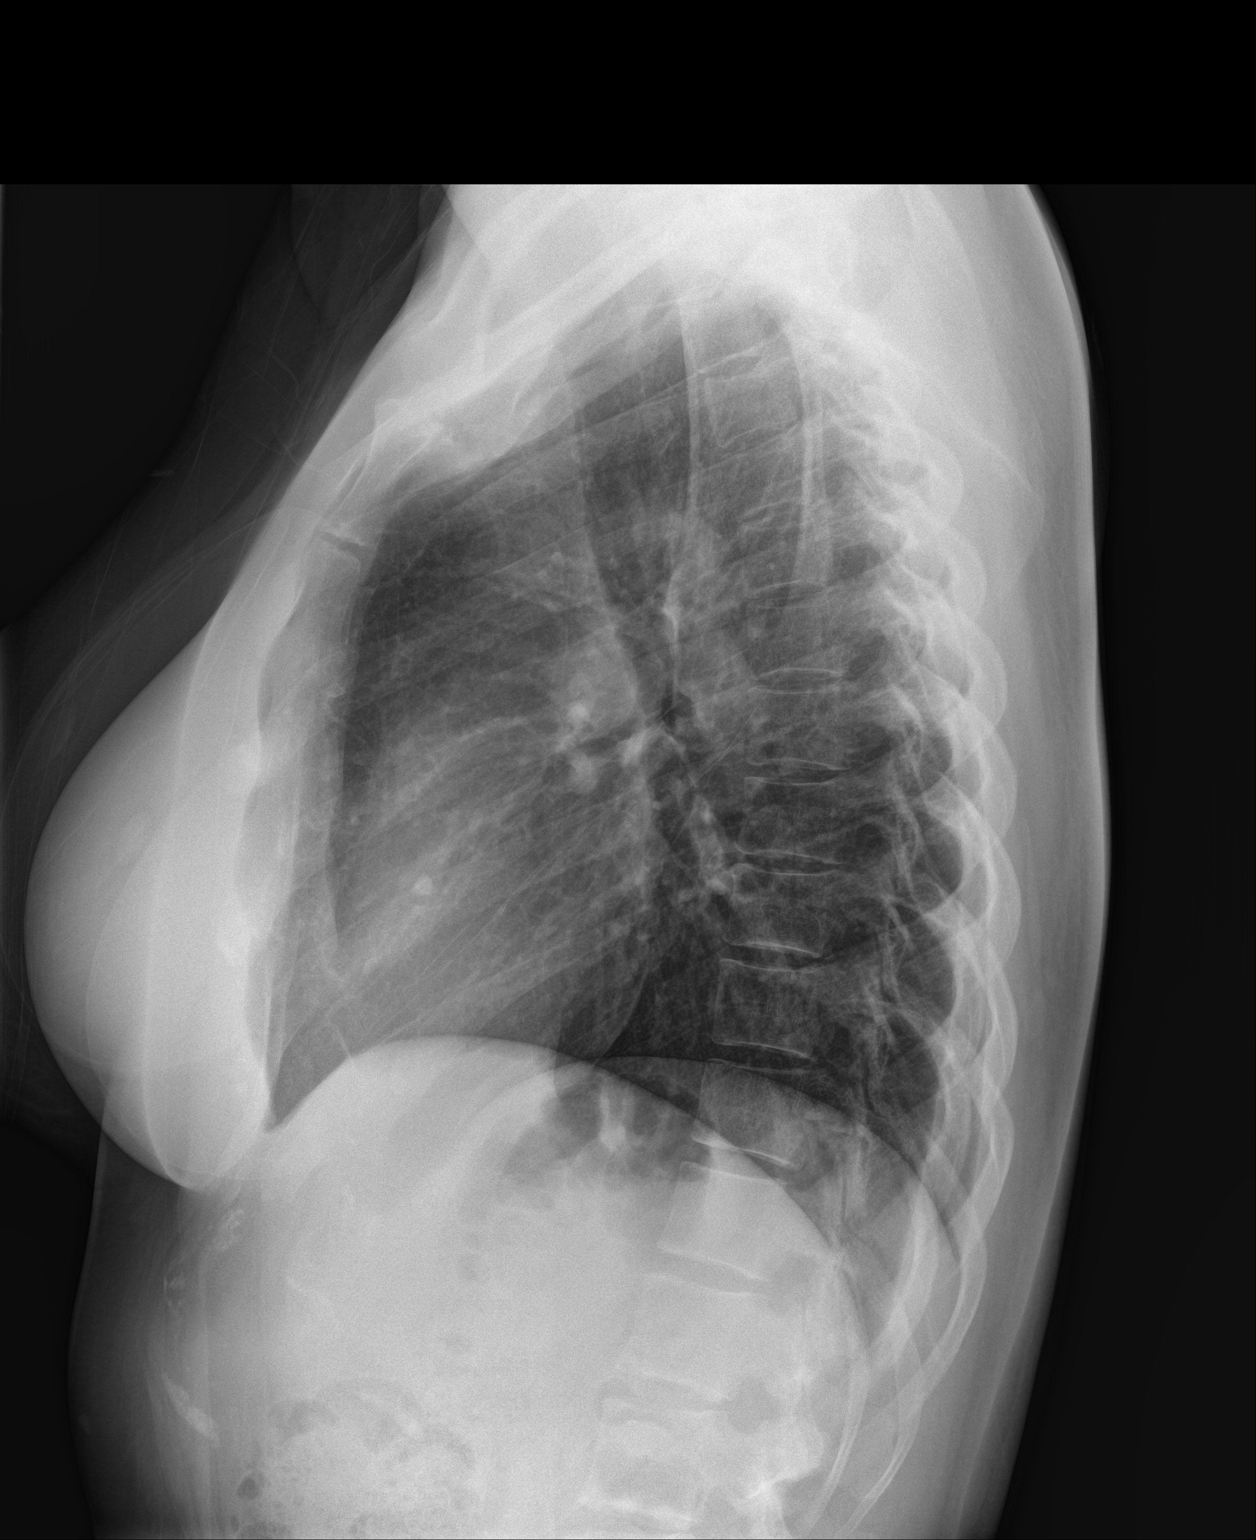

[2 of 2 positions shown; findings below may reference images not displayed]

FINDINGS: Cardiac and mediastinal contours are within normal limits. No focal
pulmonary opacity. No pleural effusion or pneumothorax. No acute
osseous abnormality.
IMPRESSION: No acute cardiopulmonary process.

## 2022-10-03 DIAGNOSIS — R079 Chest pain, unspecified: Secondary | ICD-10-CM

## 2022-10-03 DIAGNOSIS — M542 Cervicalgia: Secondary | ICD-10-CM

## 2022-10-03 NOTE — Discharge Instructions (Signed)
-  EKG was normal today.  This is reassuring that as we discussed does not completely rule out a heart problem.  Since you do not want to go to the emergency department at this time, will go home and take Aleve and Tylenol and rest.  If your chest pain does return and last for 30 minutes or longer or becomes very severe you have any of the following red flag signs/symptoms then you should call EMS or have someone take immediately to the emergency room.  CHEST PAIN RED FLAGS:  The main red flags to look out for that could mean a heart attack are; pain in the center of the chest, difficulty breathing, nausea, sweating, clammy skin, dizziness, pain radiating to the jaw/neck/arm, racing heart, etc. If any of those symptoms are present, you must immediately get to the ER.   NECK PAIN: Stressed avoiding painful activities. This can exacerbate your symptoms and make them worse.  May apply heat to the areas of pain for some relief. Use medications as directed. Be aware of which medications make you drowsy and do not drive or operate any kind of heavy machinery while using the medication (ie pain medications or muscle relaxers). F/U with PCP for reexamination or return sooner if condition worsens or does not begin to improve over the next few days.   NECK PAIN RED FLAGS: If symptoms get worse than they are right now, you should come back sooner for re-evaluation. If you have increased numbness/ tingling or notice that the numbness/tingling is affecting the legs or saddle region, go to ER. If you ever lose continence go to ER.

## 2022-10-03 NOTE — ED Triage Notes (Signed)
Patient reports chest pain that started off and on while she was at work this morning.  Patient denies chest pain at this time.  Patient reports SOB and headache that started yesterday.  Patient denies cold symptoms.
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
## 2022-12-23 IMAGING — CT CT ABD-PELV W/ CM
2 of 4 series · 16 of 46 positions shown, 18 images · IV contrast (APPLIED)
Comparison: None.

CLINICAL DATA: Generalized abdominal pain with nausea and diarrhea

EXAM:
CT ABDOMEN AND PELVIS WITH CONTRAST
TECHNIQUE: Multidetector CT imaging of the abdomen and pelvis was performed
using the standard protocol following bolus administration of
intravenous contrast.

[Series 2: abdomen 5.0 · axial · 0.75mm/px · z∈[-1047,-602]mm · 13 of 101 slices shown, 15 images]
[im 6/101  soft-tissue]
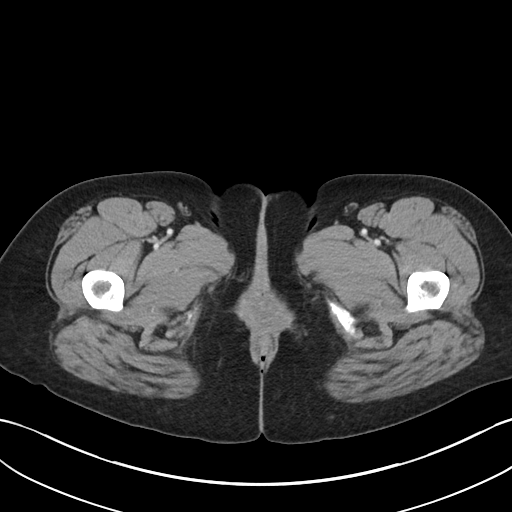
[im 6/101  bone]
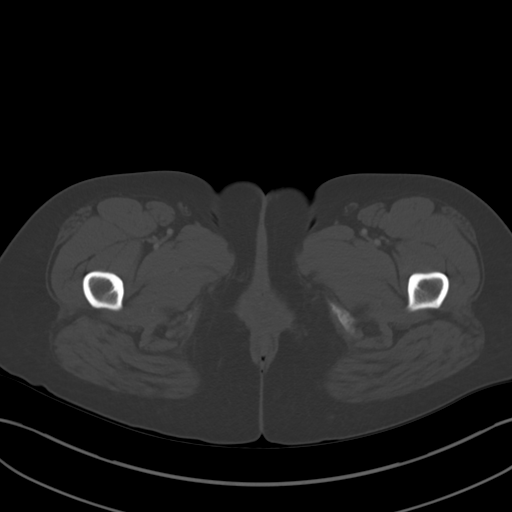
[im 12/101  soft-tissue]
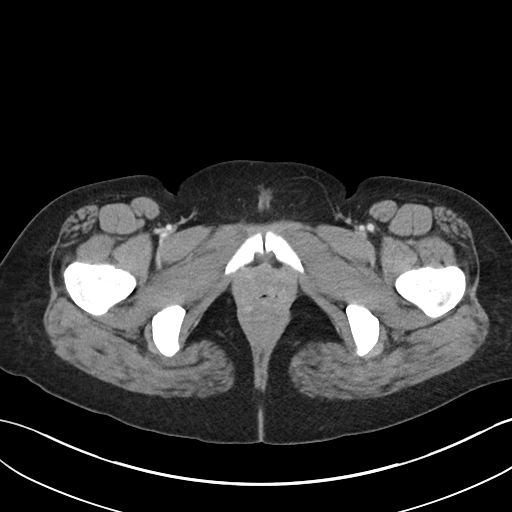
[im 24/101  soft-tissue]
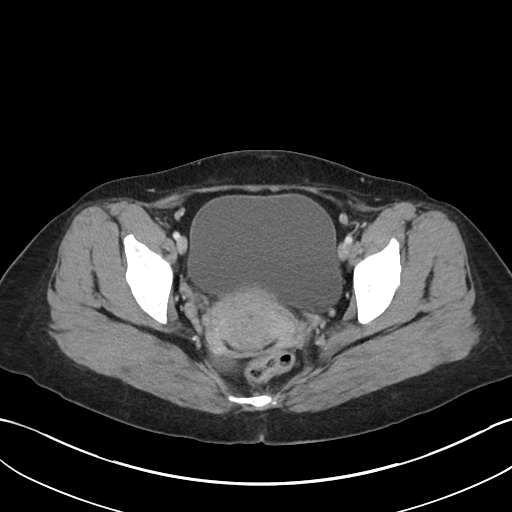
[im 30/101  soft-tissue]
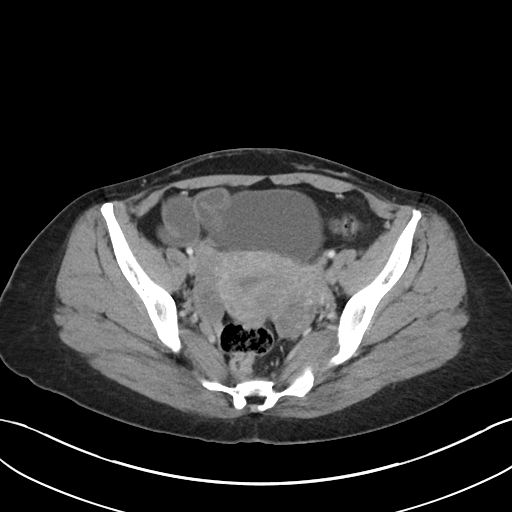
[im 36/101  soft-tissue]
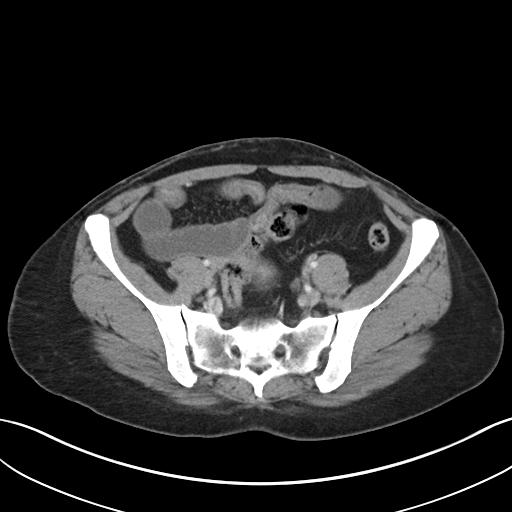
[im 42/101  soft-tissue]
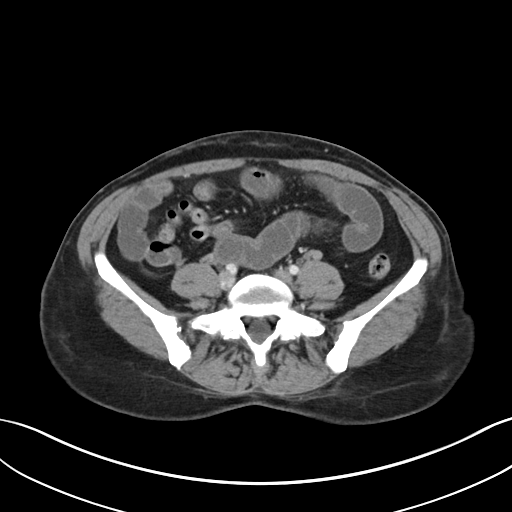
[im 53/101  soft-tissue]
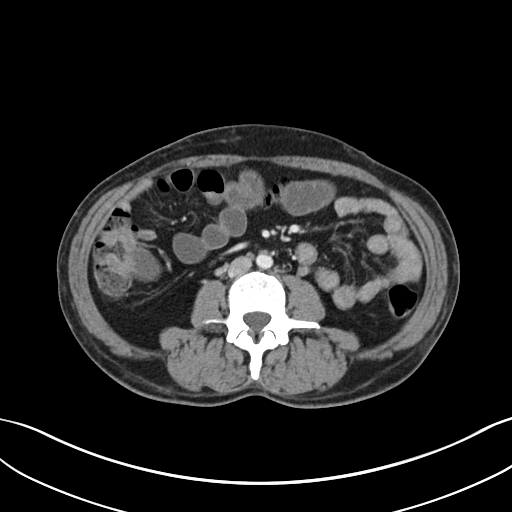
[im 59/101  soft-tissue]
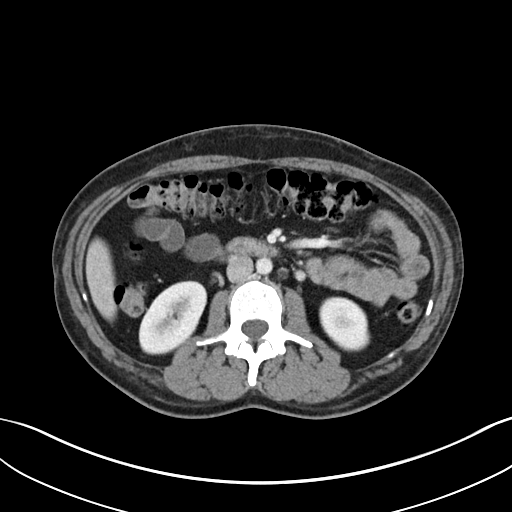
[im 65/101  soft-tissue]
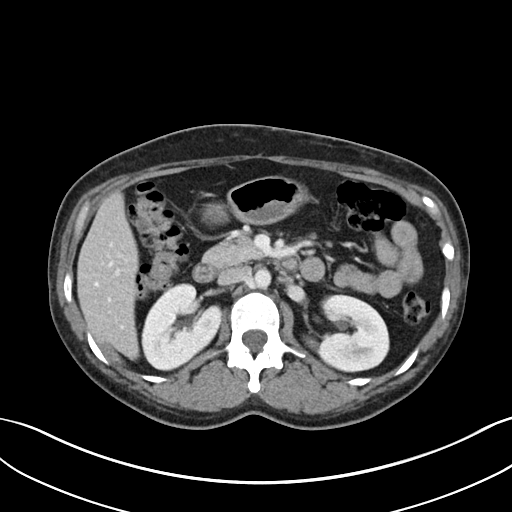
[im 65/101  bone]
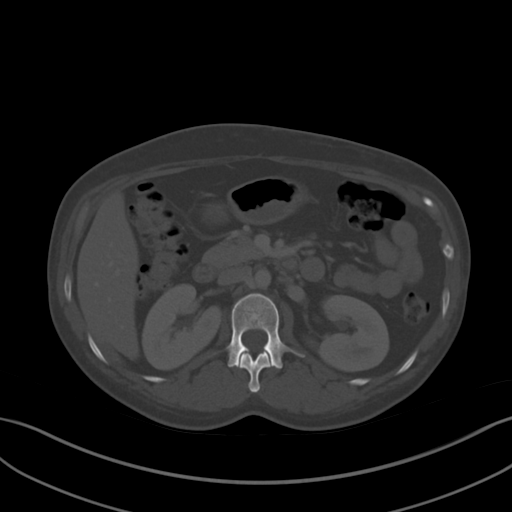
[im 71/101  soft-tissue]
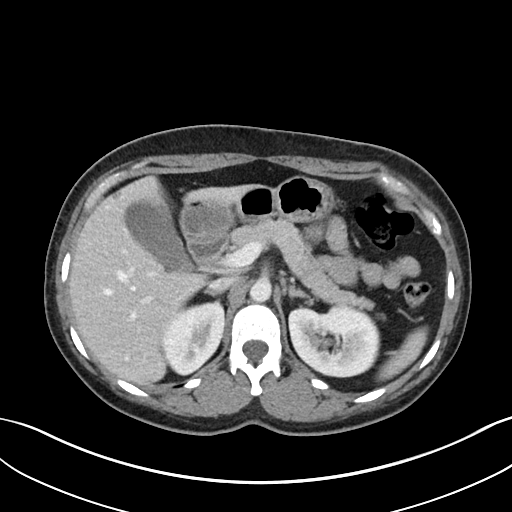
[im 77/101  soft-tissue]
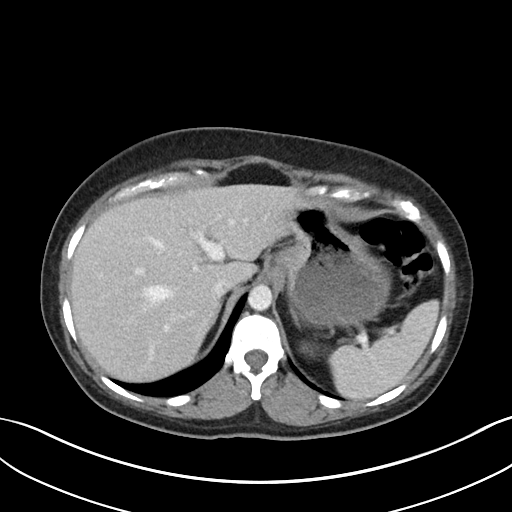
[im 89/101  soft-tissue]
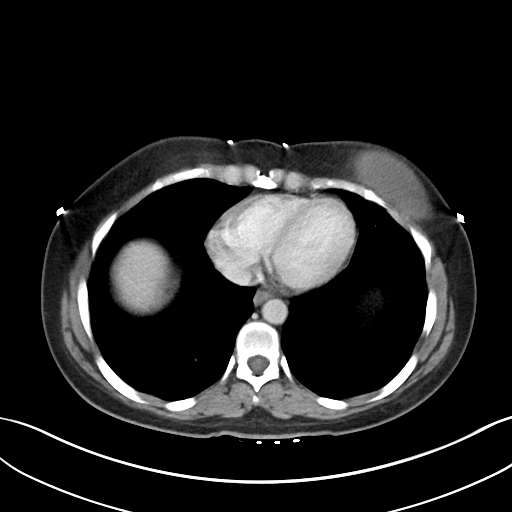
[im 95/101  soft-tissue]
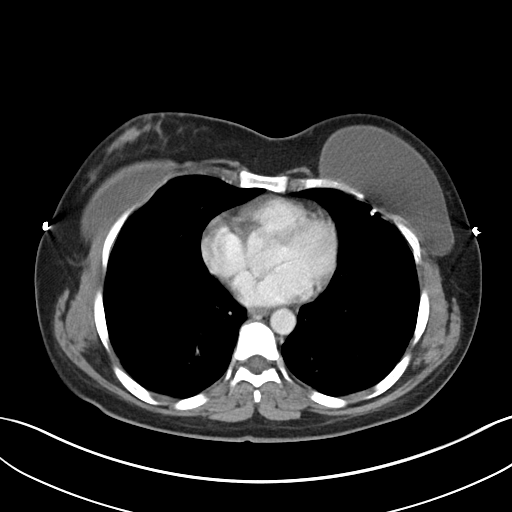

[Series 5: abdomen 3.0 mpr cor · coronal · 0.84mm/px · 3 of 86 slices shown]
[im 29/86  soft-tissue]
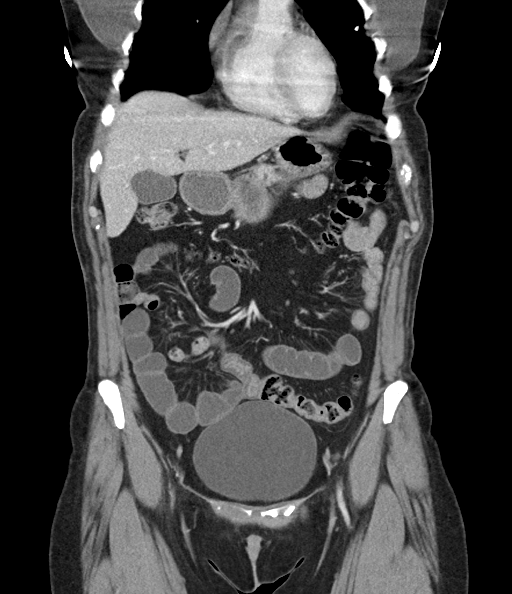
[im 38/86  soft-tissue]
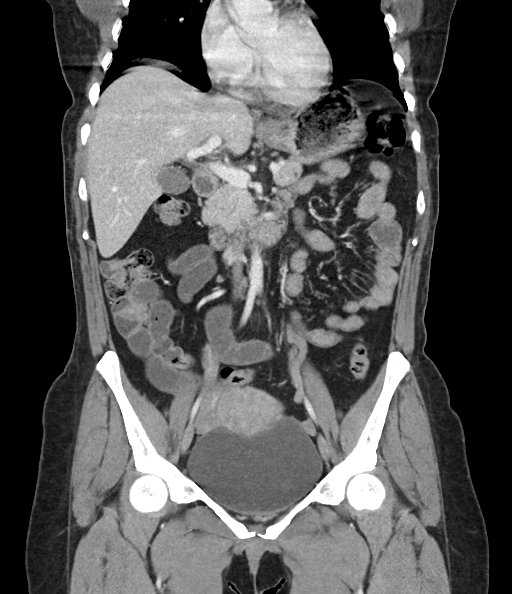
[im 48/86  soft-tissue]
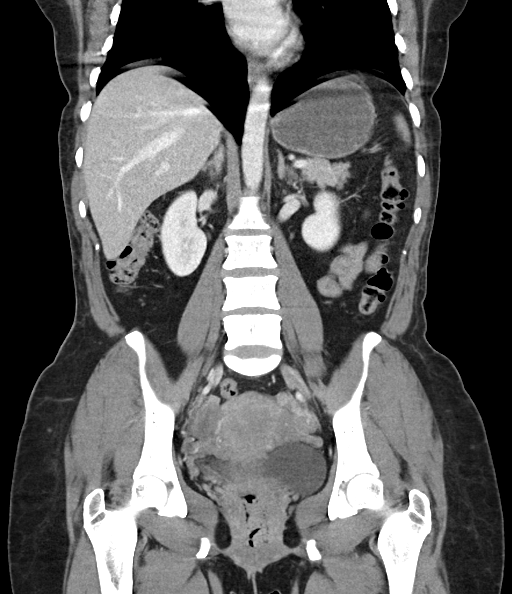

[16 of 46 positions shown; findings below may reference images not displayed]

RADIATION DOSE REDUCTION: This exam was performed according to the
departmental dose-optimization program which includes automated
exposure control, adjustment of the mA and/or kV according to
patient size and/or use of iterative reconstruction technique.

CONTRAST:  80mL OMNIPAQUE IOHEXOL 300 MG/ML  SOLN
FINDINGS: Lower chest: Lung bases demonstrate no acute consolidation or
effusion. Normal cardiac size.

Hepatobiliary: No focal liver abnormality is seen. No gallstones,
gallbladder wall thickening, or biliary dilatation.

Pancreas: Unremarkable. No pancreatic ductal dilatation or
surrounding inflammatory changes.

Spleen: Normal in size without focal abnormality.

Adrenals/Urinary Tract: Adrenal glands are unremarkable. Kidneys are
normal, without renal calculi, focal lesion, or hydronephrosis.
Bladder is unremarkable.

Stomach/Bowel: Stomach is nonenlarged. Fluid-filled nondilated loops
of mid to distal small bowel, alternating with areas of thickened
appearing small bowel with mucosal enhancement, for example series
2, image 62. Negative appendix.

Vascular/Lymphatic: No significant vascular findings are present. No
enlarged abdominal or pelvic lymph nodes.

Reproductive: Uterus and bilateral adnexa are unremarkable.

Other: No free air.  Small free fluid in the pelvis

Musculoskeletal: No acute or significant osseous findings.
IMPRESSION: 1. Fluid-filled nondilated mid to distal small bowel with areas of
small-bowel wall thickening and mucosal enhancement, findings
suspected to be related to enteritis/ileus either due to infection
or inflammatory bowel disease.
2. Small free fluid in the pelvis
